# Patient Record
Sex: Female | Born: 1940 | Race: White | Hispanic: No | Smoking: Never smoker
Health system: Southern US, Community
[De-identification: ages and names within clinical notes are randomized; demographics above are authoritative.]

---

## 2004-04-07 ENCOUNTER — Ambulatory Visit: Payer: Self-pay

## 2005-05-30 ENCOUNTER — Ambulatory Visit: Payer: Self-pay | Admitting: Family Medicine

## 2006-06-05 ENCOUNTER — Ambulatory Visit: Payer: Self-pay | Admitting: Family Medicine

## 2007-06-07 ENCOUNTER — Ambulatory Visit: Payer: Self-pay | Admitting: Family Medicine

## 2008-07-29 ENCOUNTER — Ambulatory Visit: Payer: Self-pay | Admitting: Internal Medicine

## 2009-01-21 ENCOUNTER — Ambulatory Visit: Payer: Self-pay | Admitting: Internal Medicine

## 2009-07-30 ENCOUNTER — Ambulatory Visit: Payer: Self-pay | Admitting: Internal Medicine

## 2010-08-02 ENCOUNTER — Ambulatory Visit: Payer: Self-pay | Admitting: Internal Medicine

## 2011-04-01 ENCOUNTER — Emergency Department: Payer: Self-pay | Admitting: Emergency Medicine

## 2013-06-03 ENCOUNTER — Ambulatory Visit: Payer: Self-pay | Admitting: Internal Medicine

## 2016-11-02 ENCOUNTER — Other Ambulatory Visit: Payer: Self-pay | Admitting: Internal Medicine

## 2016-11-02 DIAGNOSIS — M8580 Other specified disorders of bone density and structure, unspecified site: Secondary | ICD-10-CM

## 2016-12-04 ENCOUNTER — Other Ambulatory Visit: Payer: Self-pay

## 2017-05-16 ENCOUNTER — Emergency Department
Admission: EM | Admit: 2017-05-16 | Discharge: 2017-05-16 | Disposition: A | Discharge status: Home / Self Care | Payer: Medicare Other | Attending: Emergency Medicine | Admitting: Emergency Medicine

## 2017-05-16 ENCOUNTER — Encounter: Payer: Self-pay | Admitting: Emergency Medicine

## 2017-05-16 ENCOUNTER — Other Ambulatory Visit: Payer: Self-pay

## 2017-05-16 DIAGNOSIS — F0390 Unspecified dementia without behavioral disturbance: Secondary | ICD-10-CM | POA: Insufficient documentation

## 2017-05-16 DIAGNOSIS — K625 Hemorrhage of anus and rectum: Secondary | ICD-10-CM | POA: Diagnosis present

## 2017-05-16 DIAGNOSIS — K649 Unspecified hemorrhoids: Secondary | ICD-10-CM

## 2017-05-16 LAB — CBC
HEMATOCRIT: 43.9 % (ref 35.0–47.0)
Hemoglobin: 14.9 g/dL (ref 12.0–16.0)
MCH: 30.8 pg (ref 26.0–34.0)
MCHC: 33.9 g/dL (ref 32.0–36.0)
MCV: 90.9 fL (ref 80.0–100.0)
PLATELETS: 237 10*3/uL (ref 150–440)
RBC: 4.83 MIL/uL (ref 3.80–5.20)
RDW: 13 % (ref 11.5–14.5)
WBC: 10.7 10*3/uL (ref 3.6–11.0)

## 2017-05-16 LAB — COMPREHENSIVE METABOLIC PANEL
ALT: 23 U/L (ref 14–54)
ANION GAP: 11 (ref 5–15)
AST: 27 U/L (ref 15–41)
Albumin: 4.2 g/dL (ref 3.5–5.0)
Alkaline Phosphatase: 51 U/L (ref 38–126)
BILIRUBIN TOTAL: 0.8 mg/dL (ref 0.3–1.2)
BUN: 14 mg/dL (ref 6–20)
CO2: 25 mmol/L (ref 22–32)
Calcium: 9 mg/dL (ref 8.9–10.3)
Chloride: 103 mmol/L (ref 101–111)
Creatinine, Ser: 0.72 mg/dL (ref 0.44–1.00)
GFR calc Af Amer: >60 mL/min (ref >60)
GFR calc non Af Amer: >60 mL/min (ref >60)
Glucose, Bld: 96 mg/dL (ref 65–99)
POTASSIUM: 3.3 mmol/L — AB (ref 3.5–5.1)
Sodium: 139 mmol/L (ref 135–145)
TOTAL PROTEIN: 6.9 g/dL (ref 6.5–8.1)

## 2017-05-16 NOTE — ED Provider Notes (Signed)
[facility] Emergency Department Provider Note  ___________________________________________   First MD Initiated Contact with Patient 05/16/17 2017     (approximate)  I have reviewed the triage vital signs and the nursing notes.   HISTORY  Chief Complaint Rectal Bleeding  HPI Catherine Buchanan is a 77 y.o. female with a history of intracranial hemorrhage as well as dementia who is presenting to the emergency department today with rectal bleeding over the past 2 weeks.  She is coming by her daughter who is concerned because the patient said on the phone earlier that she was "hemorrhaging."   Patient says that she has been having bleeding intermittently with stooling and sometimes in her sleep.  Says that the blood is bright red.  The patient's other daughter who is not accompanying her to the emergency department today said that there was a quarter sized spot of bright red blood on her car seat after transporting the patient which made her concerned.  The patient's daughters are why she is here today.  The daughters asked that she be evaluated in the emergency department because of the symptoms.  Patient not reporting any lightheadedness.  Denies any pain.  Not on any blood thinners.   History reviewed. No pertinent past medical history.  There are no active problems to display for this patient.   History reviewed. No pertinent surgical history.  Prior to Admission medications   Not on File    Allergies Patient has no known allergies.  History reviewed. No pertinent family history.  Social History Social History   Tobacco Use  . Smoking status: Never Smoker  Substance Use Topics  . Alcohol use: Not on file  . Drug use: Not on file    Review of Systems  Constitutional: No fever/chills Eyes: No visual changes. ENT: No sore throat. Cardiovascular: Denies chest pain. Respiratory: Denies shortness of breath. Gastrointestinal: No abdominal pain.   No nausea, no vomiting.  No diarrhea.  No constipation. Genitourinary: Negative for dysuria. Musculoskeletal: Negative for back pain. Skin: Negative for rash. Neurological: Negative for headaches, focal weakness or numbness.   ____________________________________________   PHYSICAL EXAM:  VITAL SIGNS: ED Triage Vitals  Enc Vitals Group     BP 05/16/17 1735 121/62     Pulse Rate 05/16/17 1735 75     Resp --      Temp 05/16/17 1735 99.1 F (37.3 C)     Temp Source 05/16/17 1735 Oral     SpO2 05/16/17 1735 97 %     Weight 05/16/17 1741 175 lb 11.3 oz (79.7 kg)     Height 05/16/17 1735  (1.6 m)     Head Circumference --      Peak Flow --      Pain Score 05/16/17 1735 0     Pain Loc --      Pain Edu? --      Excl. in GC? --     Constitutional: Alert and oriented. Well appearing and in no acute distress. Eyes: Conjunctivae are normal.  Head: Atraumatic. Nose: No congestion/rhinnorhea. Mouth/Throat: Mucous membranes are moist.  Neck: No stridor.   Cardiovascular: Normal rate, regular rhythm. Grossly normal heart sounds.   Respiratory: Normal respiratory effort.  No retractions. Lungs CTAB. Gastrointestinal: Soft and nontender. No distention.   External rectal exam with multiple hemorrhoids which are non-engorged.  Nontender.  No active bleeding.  Digital exam with brown stool which is heme positive.  Musculoskeletal: No lower extremity tenderness  nor edema.  No joint effusions. Neurologic:  Normal speech and language. No gross focal neurologic deficits are appreciated. Skin:  Skin is warm, dry and intact. No rash noted. Psychiatric: Mood and affect are normal. Speech and behavior are normal.  ____________________________________________   LABS (all labs ordered are listed, but only abnormal results are displayed)  Labs Reviewed  COMPREHENSIVE METABOLIC PANEL - Abnormal; Notable for the following components:      Result Value   Potassium 3.3 (*)    All other  components within normal limits  CBC  POC OCCULT BLOOD, ED   ____________________________________________  EKG   ____________________________________________  RADIOLOGY   ____________________________________________   PROCEDURES  Procedure(s) performed:   Procedures  Critical Care performed:   ____________________________________________   INITIAL IMPRESSION / ASSESSMENT AND PLAN / ED COURSE  Pertinent labs & imaging results that were available during my care of the patient were reviewed by me and considered in my medical decision making (see chart for details).  DDX: Hemorrhoid bleed, fissure, diverticular bleed, diarrhea, colitis As part of my medical decision making, I reviewed the following data within the electronic MEDICAL RECORD NUMBERReviewed patient's past outpatient visits.  Patient with normal hemoglobin after 2 weeks of symptoms.  Recent diarrhea and patient says she is not having any difficulty moving her bowels.  We will start on Preparation H.  Patient and family said they do not need a prescription for this.  I am suspecting that this is likely related to the hemorrhoids which are bleeding.  This is consistent with a history and physical exam.  Does not appear to be more proximal source such as diverticular bleeding or duodenal or gastric bleeding.  Patient will also be given follow-up with general surgery.  Patient as well as family understanding of the plan and willing to comply. ____________________________________________   FINAL CLINICAL IMPRESSION(S) / ED DIAGNOSES  GI bleeding.  Hemorrhoids.    NEW MEDICATIONS STARTED DURING THIS VISIT:  New Prescriptions   No medications on file     Note:  This document was prepared using Dragon voice recognition software and may include unintentional dictation errors.     Myrna Blazer, MD 05/16/17 [phone]

## 2017-05-16 NOTE — ED Notes (Signed)
First Nurse Note:  Patient reports rectal bleeding with bowel movements for several days but noted blood in underwear during the night.  Patient is in no obvious distress at this time.

## 2017-05-16 NOTE — ED Triage Notes (Signed)
Pt reports that she has noticed bright red blood in her panties and on her sheets. She states that she sees it on her toilet paper. She has noticed it the last two weeks.

## 2018-03-13 ENCOUNTER — Other Ambulatory Visit: Payer: Self-pay

## 2018-03-13 ENCOUNTER — Emergency Department
Admission: EM | Admit: 2018-03-13 | Discharge: 2018-03-13 | Disposition: A | Discharge status: Home / Self Care | Payer: Medicare Other | Attending: Emergency Medicine | Admitting: Emergency Medicine

## 2018-03-13 ENCOUNTER — Encounter: Payer: Self-pay | Admitting: Emergency Medicine

## 2018-03-13 DIAGNOSIS — S3992XA Unspecified injury of lower back, initial encounter: Secondary | ICD-10-CM | POA: Diagnosis present

## 2018-03-13 DIAGNOSIS — Y939 Activity, unspecified: Secondary | ICD-10-CM | POA: Diagnosis not present

## 2018-03-13 DIAGNOSIS — F0390 Unspecified dementia without behavioral disturbance: Secondary | ICD-10-CM | POA: Diagnosis not present

## 2018-03-13 DIAGNOSIS — S39012A Strain of muscle, fascia and tendon of lower back, initial encounter: Secondary | ICD-10-CM | POA: Insufficient documentation

## 2018-03-13 DIAGNOSIS — Y33XXXA Other specified events, undetermined intent, initial encounter: Secondary | ICD-10-CM | POA: Insufficient documentation

## 2018-03-13 DIAGNOSIS — Y999 Unspecified external cause status: Secondary | ICD-10-CM | POA: Diagnosis not present

## 2018-03-13 DIAGNOSIS — Y929 Unspecified place or not applicable: Secondary | ICD-10-CM | POA: Diagnosis not present

## 2018-03-13 MED ORDER — NAPROXEN 500 MG PO TABS
500.0000 mg | ORAL_TABLET | Freq: Once | ORAL | Status: AC
  Administered 2018-03-13: 500 mg via ORAL
  Filled 2018-03-13 (×2): qty 1

## 2018-03-13 MED ORDER — NAPROXEN 500 MG PO TABS: 500.0000 mg | ORAL_TABLET | Freq: Two times a day (BID) | ORAL | 2 refills | Status: DC | PRN

## 2018-03-13 NOTE — ED Provider Notes (Signed)
[facility] Emergency Department Provider Note   ____________________________________________    I have reviewed the triage vital signs and the nursing notes.   HISTORY  Chief Complaint Back Pain  History limited by dementia   HPI Catherine Buchanan is a 78 y.o. female with history of dementia who presents with complaints of right-sided low back pain, she reports occasionally the pain radiates into her right hip.  She reports the pain started today.  She denies injury.  She denies abdominal pain nausea or vomiting.  No weakness or numbness.  She describes the pain is moderate, has not take anything for this.  History reviewed. No pertinent past medical history.  There are no active problems to display for this patient.   History reviewed. No pertinent surgical history.  Prior to Admission medications   Medication Sig Start Date End Date Taking? Authorizing Provider  naproxen (NAPROSYN) 500 MG tablet Take 1 tablet (500 mg total) by mouth 2 (two) times daily as needed for moderate pain (back pain). 03/13/18   Jene Every, MD     Allergies Patient has no known allergies.  No family history on file.  Social History Social History   Tobacco Use  . Smoking status: Never Smoker  . Smokeless tobacco: Never Used  Substance Use Topics  . Alcohol use: Not on file  . Drug use: Not on file    Review of Systems  Constitutional: No fever/chills  ENT: No neck pain   Gastrointestinal: No abdominal pain.  No nausea, no vomiting.   Genitourinary: Negative for dysuria. Musculoskeletal: As above Skin: Negative for rash. Neurological: Negative for numbness or tingling or weakness    ____________________________________________   PHYSICAL EXAM:  VITAL SIGNS: ED Triage Vitals  Enc Vitals Group     BP 03/13/18 0024 114/63     Pulse Rate 03/13/18 0024 77     Resp 03/13/18 0024 16     Temp 03/13/18 0024 98.4 F (36.9 C)     Temp Source  03/13/18 0024 Oral     SpO2 03/13/18 0024 96 %     Weight 03/13/18 0025 72.6 kg (160 lb)     Height 03/13/18 0025 1.626 m (5\' 4" )     Head Circumference --      Peak Flow --      Pain Score 03/13/18 0032 10     Pain Loc --      Pain Edu? --      Excl. in GC? --      Constitutional: Alert Eyes: Conjunctivae are normal.  Head: Atraumatic. Nose: No congestion/rhinnorhea. Mouth/Throat: Mucous membranes are moist.   Cardiovascular: Normal rate, regular rhythm.  Respiratory: Normal respiratory effort.  No retractions. Abdomen: No pulsatile mass Genitourinary: deferred Musculoskeletal: Back: No vertebral tenderness palpation, right paraspinal lumbar muscle spasm which is the location of her pain Neurologic:  Normal speech and language. No gross focal neurologic deficits are appreciated.  Normal strength in the lower extremities, no saddle anesthesia Skin:  Skin is warm, dry and intact. No rash noted.   ____________________________________________   LABS (all labs ordered are listed, but only abnormal results are displayed)  Labs Reviewed - No data to display ____________________________________________  EKG   ____________________________________________  RADIOLOGY  None ____________________________________________   PROCEDURES  Procedure(s) performed: No  Procedures   Critical Care performed: No ____________________________________________   INITIAL IMPRESSION / ASSESSMENT AND PLAN / ED COURSE  Pertinent labs & imaging results that were available during my  care of the patient were reviewed by me and considered in my medical decision making (see chart for details).  Patient well-appearing in no acute distress, exam is quite reassuring, no red flags more specifically no neuro deficits saddle anesthesia abdominal pain, etc.  Treated with NSAID, outpatient follow-up with PCP   ____________________________________________   FINAL CLINICAL IMPRESSION(S) / ED  DIAGNOSES  Final diagnoses:  Strain of lumbar region, initial encounter      NEW MEDICATIONS STARTED DURING THIS VISIT:  Discharge Medication List as of 03/13/2018  3:53 AM    START taking these medications   Details  naproxen (NAPROSYN) 500 MG tablet Take 1 tablet (500 mg total) by mouth 2 (two) times daily as needed for moderate pain (back pain)., Starting Wed 03/13/2018, Print         Note:  This document was prepared using Dragon voice recognition software and may include unintentional dictation errors.   Jene Every, MD 03/13/18 [phone]

## 2018-03-13 NOTE — ED Triage Notes (Signed)
Pt to triage via w/c with no distress noted, brought in by EMS; pt reports mid lower back pain tonight with no accomp symptoms, denies injury, denies hx of same, denies radiating pain

## 2018-09-20 ENCOUNTER — Other Ambulatory Visit: Payer: Self-pay

## 2018-09-20 DIAGNOSIS — Z20822 Contact with and (suspected) exposure to covid-19: Secondary | ICD-10-CM

## 2018-09-22 LAB — NOVEL CORONAVIRUS, NAA: SARS-CoV-2, NAA: NOT DETECTED

## 2018-10-10 ENCOUNTER — Other Ambulatory Visit: Payer: Self-pay

## 2018-10-10 DIAGNOSIS — Z20822 Contact with and (suspected) exposure to covid-19: Secondary | ICD-10-CM

## 2018-10-11 ENCOUNTER — Other Ambulatory Visit: Payer: Self-pay

## 2018-10-11 DIAGNOSIS — Z20822 Contact with and (suspected) exposure to covid-19: Secondary | ICD-10-CM

## 2018-10-11 LAB — NOVEL CORONAVIRUS, NAA: SARS-CoV-2, NAA: NOT DETECTED

## 2018-10-12 LAB — NOVEL CORONAVIRUS, NAA: SARS-CoV-2, NAA: NOT DETECTED

## 2019-02-15 ENCOUNTER — Emergency Department: Payer: Medicare Other

## 2019-02-15 ENCOUNTER — Other Ambulatory Visit: Payer: Self-pay

## 2019-02-15 ENCOUNTER — Emergency Department
Admission: EM | Admit: 2019-02-15 | Discharge: 2019-02-15 | Disposition: A | Discharge status: Home / Self Care | Payer: Medicare Other | Attending: Emergency Medicine | Admitting: Emergency Medicine

## 2019-02-15 DIAGNOSIS — U071 COVID-19: Secondary | ICD-10-CM | POA: Diagnosis not present

## 2019-02-15 DIAGNOSIS — Y999 Unspecified external cause status: Secondary | ICD-10-CM | POA: Insufficient documentation

## 2019-02-15 DIAGNOSIS — Y9389 Activity, other specified: Secondary | ICD-10-CM | POA: Diagnosis not present

## 2019-02-15 DIAGNOSIS — W19XXXA Unspecified fall, initial encounter: Secondary | ICD-10-CM | POA: Diagnosis not present

## 2019-02-15 DIAGNOSIS — M542 Cervicalgia: Secondary | ICD-10-CM | POA: Insufficient documentation

## 2019-02-15 DIAGNOSIS — I959 Hypotension, unspecified: Secondary | ICD-10-CM | POA: Diagnosis present

## 2019-02-15 DIAGNOSIS — E860 Dehydration: Secondary | ICD-10-CM | POA: Insufficient documentation

## 2019-02-15 DIAGNOSIS — Y92128 Other place in nursing home as the place of occurrence of the external cause: Secondary | ICD-10-CM | POA: Insufficient documentation

## 2019-02-15 LAB — CBC WITH DIFFERENTIAL/PLATELET
Abs Immature Granulocytes: 0.04 10*3/uL (ref 0.00–0.07)
Basophils Absolute: 0.1 10*3/uL (ref 0.0–0.1)
Basophils Relative: 1 %
Eosinophils Absolute: 0.2 10*3/uL (ref 0.0–0.5)
Eosinophils Relative: 3 %
HCT: 40.2 % (ref 36.0–46.0)
Hemoglobin: 13.7 g/dL (ref 12.0–15.0)
Immature Granulocytes: 1 %
Lymphocytes Relative: 31 %
Lymphs Abs: 2.4 10*3/uL (ref 0.7–4.0)
MCH: 30.9 pg (ref 26.0–34.0)
MCHC: 34.1 g/dL (ref 30.0–36.0)
MCV: 90.5 fL (ref 80.0–100.0)
Monocytes Absolute: 0.6 10*3/uL (ref 0.1–1.0)
Monocytes Relative: 8 %
Neutro Abs: 4.4 10*3/uL (ref 1.7–7.7)
Neutrophils Relative %: 56 %
Platelets: 160 10*3/uL (ref 150–400)
RBC: 4.44 MIL/uL (ref 3.87–5.11)
RDW: 12.9 % (ref 11.5–15.5)
WBC: 7.7 10*3/uL (ref 4.0–10.5)
nRBC: 0 % (ref 0.0–0.2)

## 2019-02-15 LAB — COMPREHENSIVE METABOLIC PANEL
ALT: 24 U/L (ref 0–44)
AST: 24 U/L (ref 15–41)
Albumin: 3.7 g/dL (ref 3.5–5.0)
Alkaline Phosphatase: 52 U/L (ref 38–126)
Anion gap: 10 (ref 5–15)
BUN: 28 mg/dL — ABNORMAL HIGH (ref 8–23)
CO2: 24 mmol/L (ref 22–32)
Calcium: 9.4 mg/dL (ref 8.9–10.3)
Chloride: 106 mmol/L (ref 98–111)
Creatinine, Ser: 1.05 mg/dL — ABNORMAL HIGH (ref 0.44–1.00)
GFR calc Af Amer: 59 mL/min — ABNORMAL LOW (ref >60)
GFR calc non Af Amer: 51 mL/min — ABNORMAL LOW (ref >60)
Glucose, Bld: 162 mg/dL — ABNORMAL HIGH (ref 70–99)
Potassium: 3 mmol/L — ABNORMAL LOW (ref 3.5–5.1)
Sodium: 140 mmol/L (ref 135–145)
Total Bilirubin: 0.9 mg/dL (ref 0.3–1.2)
Total Protein: 6.3 g/dL — ABNORMAL LOW (ref 6.5–8.1)

## 2019-02-15 LAB — URINALYSIS, COMPLETE (UACMP) WITH MICROSCOPIC
Bacteria, UA: NONE SEEN
Bilirubin Urine: NEGATIVE
Glucose, UA: NEGATIVE mg/dL
Hgb urine dipstick: NEGATIVE
Ketones, ur: NEGATIVE mg/dL
Leukocytes,Ua: NEGATIVE
Nitrite: NEGATIVE
Protein, ur: NEGATIVE mg/dL
Specific Gravity, Urine: 1.014 (ref 1.005–1.030)
Squamous Epithelial / LPF: NONE SEEN (ref 0–5)
pH: 5 (ref 5.0–8.0)

## 2019-02-15 LAB — RESPIRATORY PANEL BY RT PCR (FLU A&B, COVID)
Influenza A by PCR: NEGATIVE
Influenza B by PCR: NEGATIVE
SARS Coronavirus 2 by RT PCR: POSITIVE — AB

## 2019-02-15 LAB — TROPONIN I (HIGH SENSITIVITY): Troponin I (High Sensitivity): <2 ng/L (ref <18)

## 2019-02-15 MED ORDER — SODIUM CHLORIDE 0.9 % IV BOLUS
1000.0000 mL | Freq: Once | INTRAVENOUS | Status: AC
  Administered 2019-02-15: 18:00:00 1000 mL via INTRAVENOUS

## 2019-02-15 MED ORDER — SODIUM CHLORIDE 0.9 % IV BOLUS
1000.0000 mL | Freq: Once | INTRAVENOUS | Status: AC
  Administered 2019-02-15: 1000 mL via INTRAVENOUS

## 2019-02-15 NOTE — ED Notes (Signed)
Pt unable to sign discharge paper due to hx of dementia. Bosie Clos, RN received verbal consent for discharge from daughter, Dorna Mai.

## 2019-02-15 NOTE — ED Notes (Addendum)
Upon entering the room to check on the pt, this RN found the pt standing at the foot of the bed trying to go to the bathroom. This RN assisted pt to the toilet and back to the bed. Pt noted to be slightly unsteady on her feet. Stretcher locked in lowest position, bed alarm turned on, call bell within reach. This RN reminded pt to use the call bell and not to get up on her own.  Yellow socks placed on pt, yellow fall bracelet on pt.

## 2019-02-15 NOTE — ED Provider Notes (Signed)
[facility] Emergency Department Provider Note  Time seen: 6:24 PM  I have reviewed the triage vital signs and the nursing notes.   HISTORY  Chief Complaint Fall and Hypotension   HPI Catherine Buchanan is a 79 y.o. female presents to the emergency department after a fall found to be hypotensive.  According to EMS report patient is coming from [place] facility.  Patient had an unwitnessed fall while trying to go to the bathroom.  Initial blood pressure per EMS on arrival 66/36.  96/52 on arrival to the emergency department.  Currently patient is awake and alert, no distress is complaining of some neck pain since the fall but denies any back pain.  Is not sure if she hit her head per patient.  Denies LOC.   EMS states some abdominal pain complaint however upon arrival patient denies any abdominal pain.  Benign abdominal exam.  History reviewed. No pertinent past medical history.  There are no problems to display for this patient.   History reviewed. No pertinent surgical history.  Prior to Admission medications   Medication Sig Start Date End Date Taking? Authorizing Provider  naproxen (NAPROSYN) 500 MG tablet Take 1 tablet (500 mg total) by mouth 2 (two) times daily as needed for moderate pain (back pain). 03/13/18   Lavonia Drafts, MD    No Known Allergies  No family history on file.  Social History Social History   Tobacco Use  . Smoking status: Never Smoker  . Smokeless tobacco: Never Used  Substance Use Topics  . Alcohol use: Not on file  . Drug use: Not on file    Review of Systems Constitutional: Negative for fever. Cardiovascular: Negative for chest pain. Respiratory: Negative for shortness of breath. Gastrointestinal: Negative for abdominal pain, vomiting and diarrhea. Genitourinary: Negative for urinary compaints Musculoskeletal: Neck pain. Skin: Negative for skin complaints  Neurological: Negative for headache All other  ROS negative  ____________________________________________   PHYSICAL EXAM:  VITAL SIGNS: ED Triage Vitals [02/15/19 1817]  Enc Vitals Group     BP      Pulse      Resp      Temp      Temp src      SpO2      Weight 165 lb (74.8 kg)     Height 5\' 8"  (1.727 m)     Head Circumference      Peak Flow      Pain Score 0     Pain Loc      Pain Edu?      Excl. in [place]?    Constitutional: Alert. Well appearing and in no distress. Eyes: Normal exam ENT      Head: Normocephalic and atraumatic.      Mouth/Throat: Mucous membranes are moist. Cardiovascular: Normal rate, regular rhythm. Respiratory: Normal respiratory effort without tachypnea nor retractions. Breath sounds are clear  Gastrointestinal: Soft and nontender. No distention. Musculoskeletal: Mild midline cervical tenderness to palpation. Neurologic:  Normal speech and language. No gross focal neurologic deficits Skin:  Skin is warm, dry and intact.  Psychiatric: Mood and affect are normal.   ____________________________________________    EKG  EKG viewed and interpreted by myself shows a normal sinus rhythm at 68 bpm with a narrow QRS, normal axis, normal intervals nonspecific ST changes.  ____________________________________________    RADIOLOGY  CT head and C-spine are negative for acute abnormality. Chest x-ray is negative.  ____________________________________________   INITIAL IMPRESSION / ASSESSMENT  AND PLAN / ED COURSE  Pertinent labs & imaging results that were available during my care of the patient were reviewed by me and considered in my medical decision making (see chart for details).   Patient presents to the emergency department for a fall found to be hypotensive currently 85 systolic in the emergency department.  Patient is awake alert, no distress.  Does have mild C-spine tenderness palpation remainder of the exam is benign including benign abdomen.  No signs of head trauma no back pain.  We will  obtain CT scans of the head and C-spine as a precaution we will check labs, IV hydrate and continue to closely monitor.  Patient's imaging is negative, lab work is largely reassuring.  Covid test is positive however daughter states the patient has a +3 to 4 weeks ago initially.  Patient appears much better after fluids blood pressure currently around 115 systolic.  Patient is mentating very well, has no complaints at this time.  We will discharge back to her nursing facility.  Catherine Buchanan was evaluated in Emergency Department on 02/15/2019 for the symptoms described in the history of present illness. She was evaluated in the context of the global COVID-19 pandemic, which necessitated consideration that the patient might be at risk for infection with the SARS-CoV-2 virus that causes COVID-19. Institutional protocols and algorithms that pertain to the evaluation of patients at risk for COVID-19 are in a state of rapid change based on information released by regulatory bodies including the CDC and federal and state organizations. These policies and algorithms were followed during the patient's care in the ED.  ____________________________________________   FINAL CLINICAL IMPRESSION(S) / ED DIAGNOSES  Fall Hypotension   Minna Antis, MD 02/15/19 2212

## 2019-02-15 NOTE — ED Notes (Signed)
Daughter states that mother tested positive for COVID one month ago, was asymptomatic and stayed at [facility] while positive.  Also stated that mother has hx of dementia, baseline to be disoriented to time

## 2019-02-15 NOTE — ED Notes (Signed)
In and out cath performed with Lorin Picket, NT

## 2019-02-15 NOTE — ED Triage Notes (Signed)
Pt to ED via [facility] from [facility] for chief complaint of fall, hypotension. EMS reports pt had an unwitnessed fall while trying to go to the bathroom. BP was 76/46 on arrival, placed in [place] for a BP of 96/52. BP 66/36 on arrival.  Pt disoriented to time, situation and place C/o neck pain at this time EDP at bedside

## 2019-02-15 NOTE — ED Notes (Signed)
Attempted to speak with daughter regarding providing transportation back to [facility], daughter stated she had not yet had COVID and was unsure whether she wanted to provide transportation, daughter proceeded to put husband on the phone, Husband spoke rudely to this RN stating that he would not be providing transportation d/t pt being COVID +.

## 2019-02-15 NOTE — Discharge Instructions (Addendum)
Please drink plenty of fluids over the next several days, return to the emergency department for any further falls, lightheadedness, or any other symptom personally concerning to yourself.

## 2019-02-15 NOTE — ED Notes (Addendum)
Report given to [employer], [facility].  Daughter, Dorna Mai updated on discharge, no other questions or concerns for d/c, aware that pt will be going back to [facility].

## 2020-03-18 IMAGING — CT CT HEAD W/O CM
2 series · 15 of 37 positions shown, 18 images · non-contrast
Comparison: None.

CLINICAL DATA: Fall.

EXAM:
CT HEAD WITHOUT CONTRAST
CT CERVICAL SPINE WITHOUT CONTRAST
TECHNIQUE: Multidetector CT imaging of the head and cervical spine was
performed following the standard protocol without intravenous
contrast. Multiplanar CT image reconstructions of the cervical spine
were also generated.

[Series 5: head wo · axial · 0.40mm/px · z∈[+450,+575]mm · 12 of 30 slices shown, 15 images]
[im 3/30  brain]
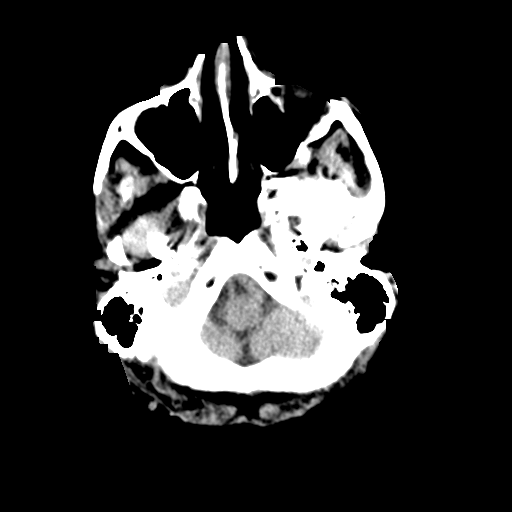
[im 3/30  bone]
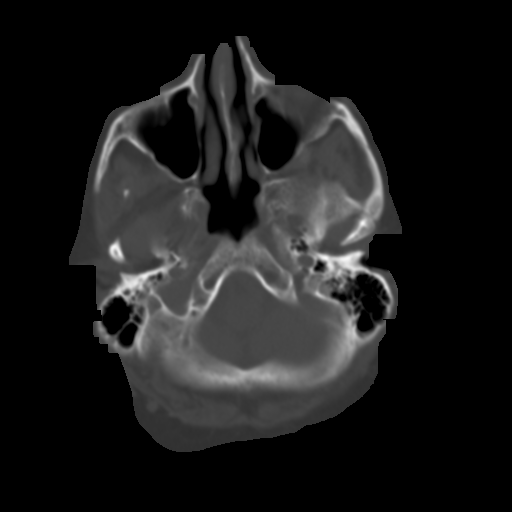
[im 5/30  brain]
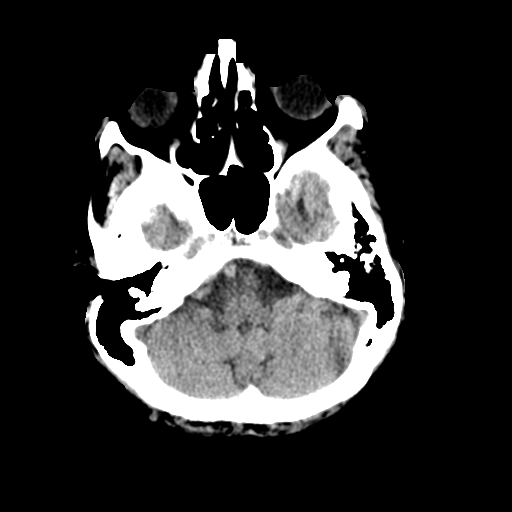
[im 7/30  brain]
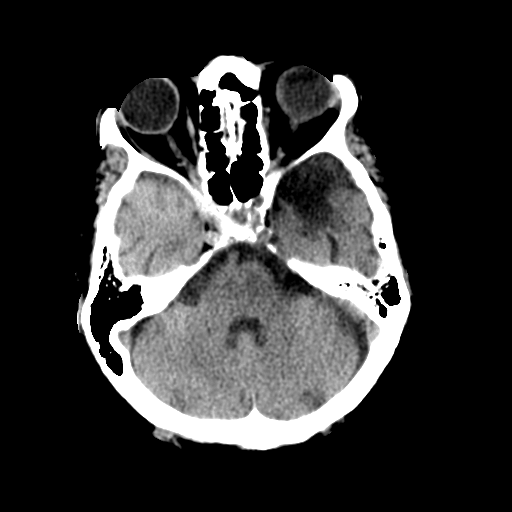
[im 10/30  brain]
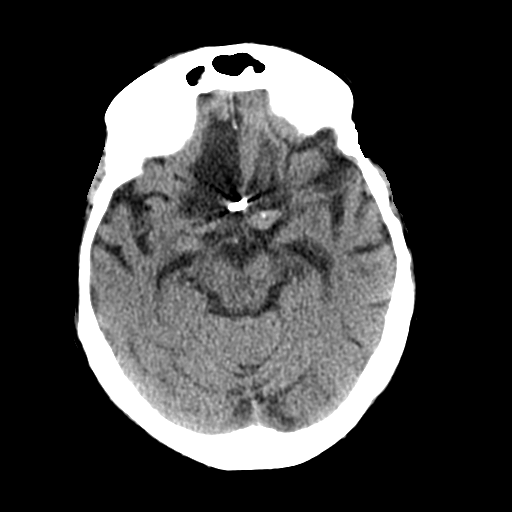
[im 12/30  brain]
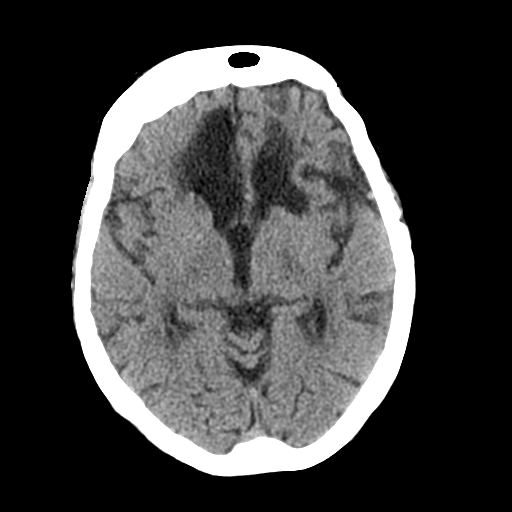
[im 12/30  bone]
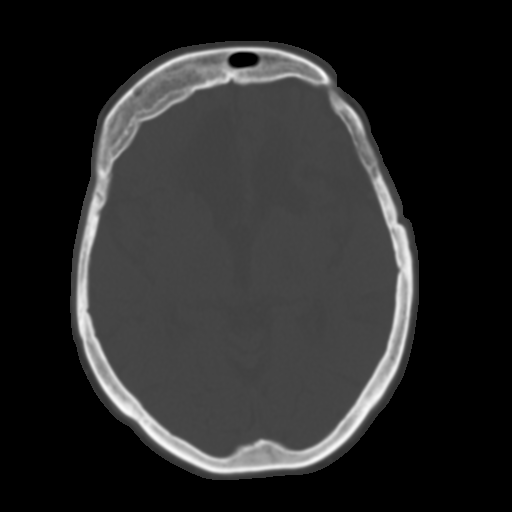
[im 14/30  brain]
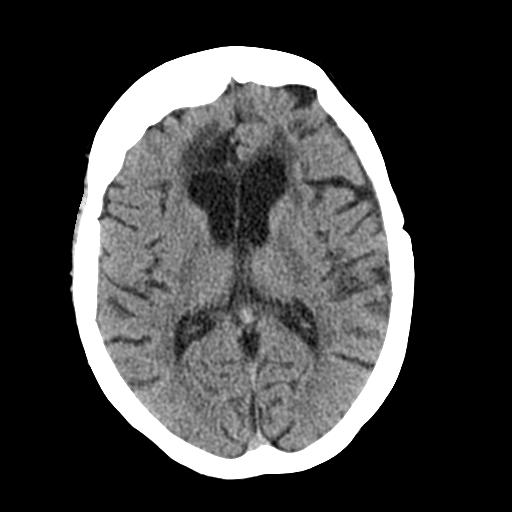
[im 17/30  brain]
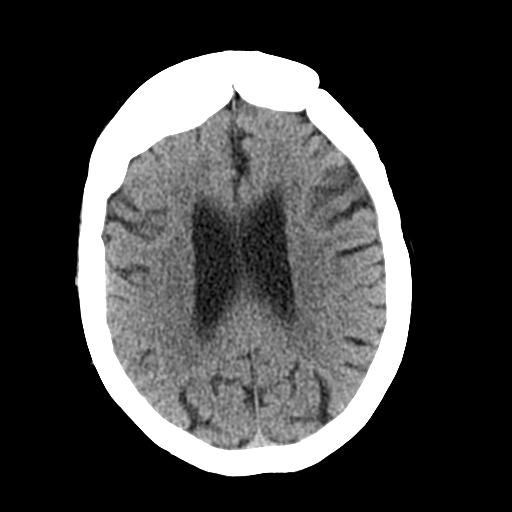
[im 19/30  brain]
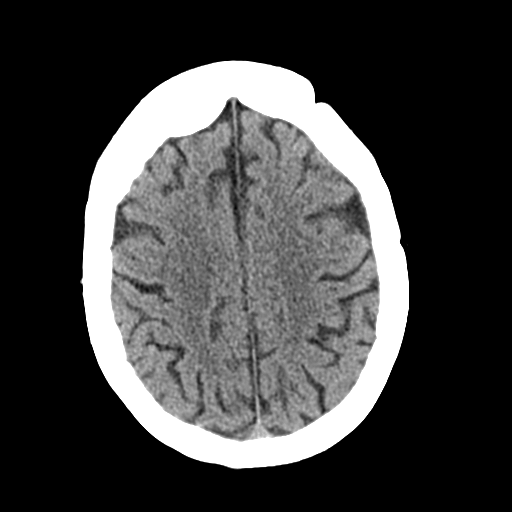
[im 21/30  brain]
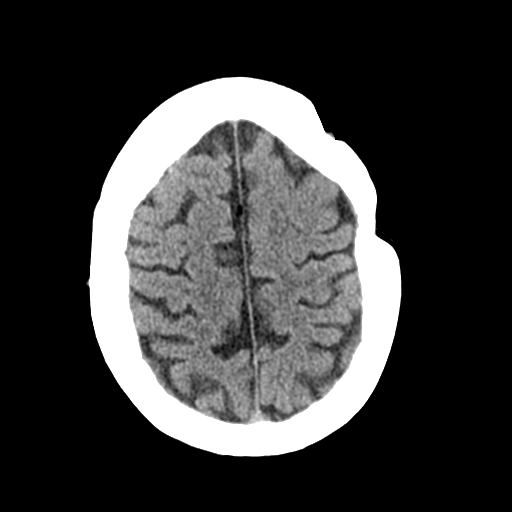
[im 21/30  bone]
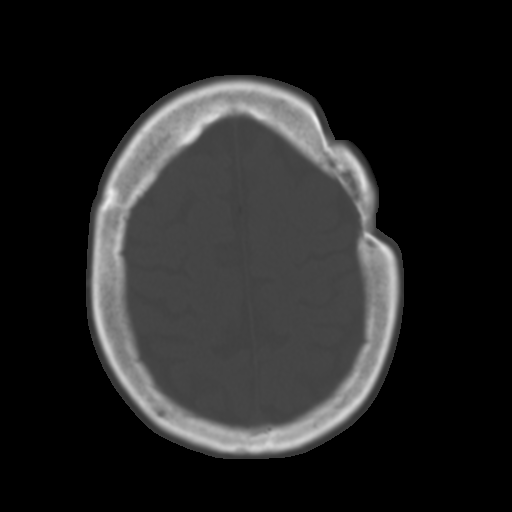
[im 24/30  brain]
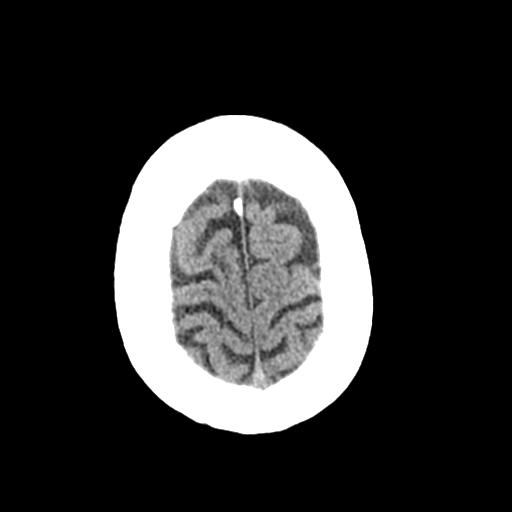
[im 26/30  brain]
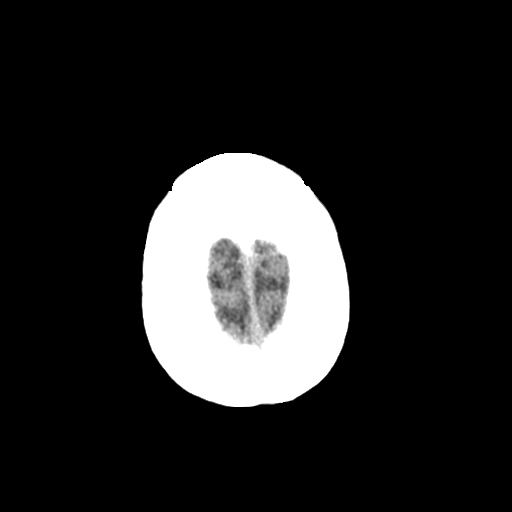
[im 28/30  brain]
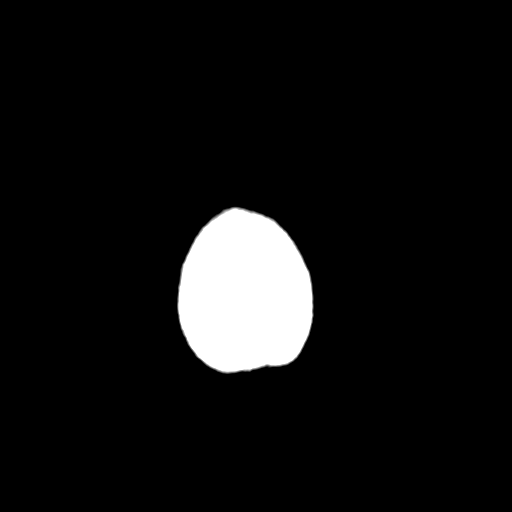

[Series 7: sagittal soft tissue · sagittal · 0.30mm/px · 3 of 48 slices shown]
[im 16/48  brain]
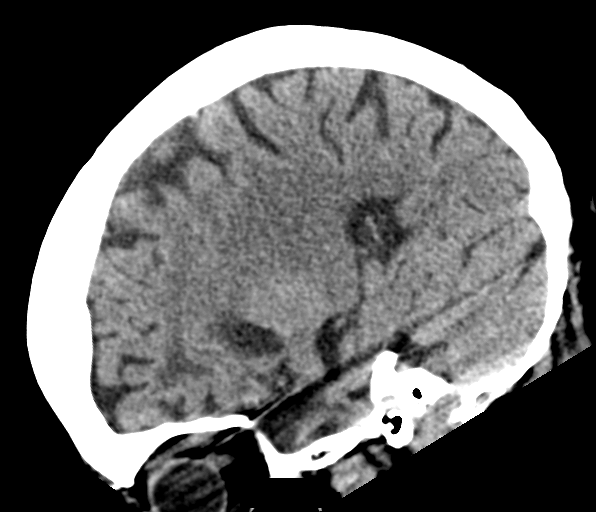
[im 24/48  brain]
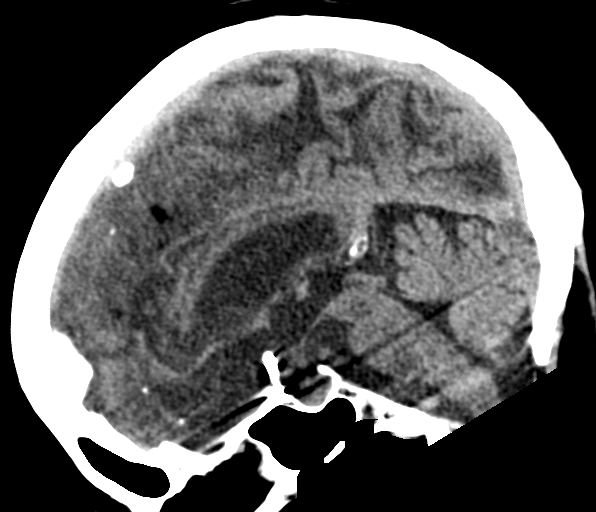
[im 32/48  brain]
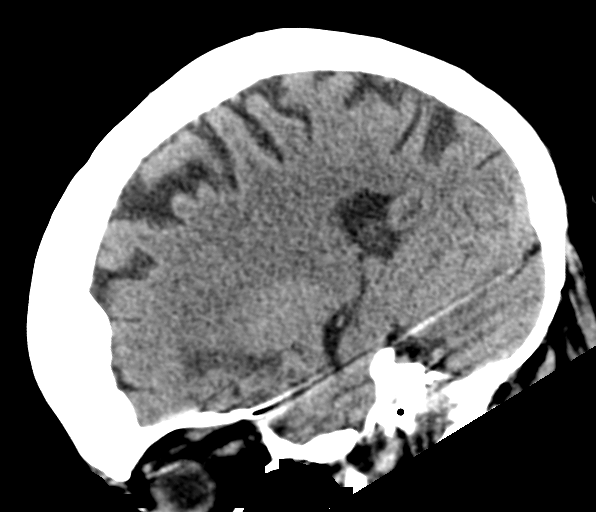

[15 of 37 positions shown; findings below may reference images not displayed]

FINDINGS: CT HEAD FINDINGS

Brain: No subdural, epidural, or subarachnoid hemorrhage. Clips are
seen in the suprasellar cistern, likely due to previous aneurysm
repair. There is encephalomalacia in the anterior tip of the left
temporal lobe and in the bilateral frontal lobes, right greater than
left. Ventricles and sulci are unremarkable. Cerebellum, brainstem,
and basal cisterns are normal. No mass effect or midline shift.
White matter changes are identified in the frontal lobes, likely
from previous aneurysm repair/clipping. No acute cortical ischemia
or infarct is identified.

Vascular: Calcified atherosclerosis in the intracranial carotids.

Skull: Previous left craniotomy. Hyperostosis frontalis is
identified.

Sinuses/Orbits: Debris in the right sphenoid sinus. No fluid.
Paranasal sinuses, mastoid air cells, and middle ears otherwise
normal.

Other: No other abnormalities.

CT CERVICAL SPINE FINDINGS

Alignment: Normal.

Skull base and vertebrae: No acute fracture. No primary bone lesion
or focal pathologic process.

Soft tissues and spinal canal: Atherosclerotic change in the
thoracic aorta. No other abnormalities.

Disc levels:  Multilevel degenerative changes.

Upper chest: Negative.

Other: No other abnormalities.
IMPRESSION: 1. No acute intracranial abnormality. Chronic changes from previous
clipping/repair of an aneurysm.
2. No fracture or traumatic malalignment in the cervical spine.

## 2022-12-06 ENCOUNTER — Telehealth: Payer: Self-pay

## 2022-12-06 NOTE — Telephone Encounter (Signed)
Crystal called the patient nursing home called in to see what is needed for the patient referral.

## 2023-02-06 ENCOUNTER — Ambulatory Visit: Payer: Medicare (Managed Care) | Admitting: Gastroenterology

## 2023-02-08 ENCOUNTER — Encounter: Payer: Self-pay | Admitting: Gastroenterology

## 2023-02-08 ENCOUNTER — Ambulatory Visit: Payer: Medicare (Managed Care) | Admitting: Gastroenterology

## 2023-02-08 VITALS — BP 136/77 | HR 84 | Temp 98.3°F | Ht 63.0 in | Wt 170.0 lb

## 2023-02-08 DIAGNOSIS — K642 Third degree hemorrhoids: Secondary | ICD-10-CM | POA: Diagnosis not present

## 2023-02-08 NOTE — Patient Instructions (Signed)
High-Fiber Eating Plan Fiber, also called dietary fiber, is found in foods such as fruits, vegetables, whole grains, and beans. A high-fiber diet can be good for your health. Your health care provider may recommend a high-fiber diet to help: Prevent trouble pooping (constipation). Lower your cholesterol. Treat the following conditions: Hemorrhoids. This is inflammation of veins in the anus. Inflammation of specific areas of the digestive tract. Irritable bowel syndrome (IBS). This is a problem of the large intestine, also called the colon, that sometimes causes belly pain and bloating. Prevent overeating as part of a weight-loss plan. Lower the risk of heart disease, type 2 diabetes, and certain cancers. What are tips for following this plan? Reading food labels  Check the nutrition facts label on foods for the amount of dietary fiber. Choose foods that have 4 grams of fiber or more per serving. The recommended goals for how much fiber you should eat each day include: Males 23 years old or younger: 30-34 g. Males over 66 years old: 28-34 g. Females 27 years old or younger: 25-28 g. Females over 14 years old: 22-25 g. Your daily fiber goal is _____________ g. Shopping Choose whole fruits and vegetables instead of processed. For example, choose apples instead of apple juice or applesauce. Choose a variety of high-fiber foods such as avocados, lentils, oats, and pinto beans. Read the nutrition facts label on foods. Check for foods with added fiber. These foods often have high sugar and salt (sodium) amounts per serving. Cooking Use whole-grain flour for baking and cooking. Cook with brown rice instead of white rice. Make meals that have a lot of beans and vegetables in them, such as chili or vegetable-based soups. Meal planning Start the day with a breakfast that is high in fiber, such as a cereal that has 5 g of fiber or more per serving. Eat breads and cereals that are made with  whole-grain flour instead of refined flour or white flour. Eat brown rice, bulgur wheat, or millet instead of white rice. Use beans in place of meat in soups, salads, and pasta dishes. Be sure that half of the grains you eat each day are whole grains. General information You can get the recommended amount of dietary fiber by: Eating a variety of fruits, vegetables, grains, nuts, and beans. Taking a fiber supplement if you aren't able to eat enough fiber. It's better to get fiber through food than from a supplement. Slowly increase how much fiber you eat. If you increase the amount of fiber you eat too quickly, you may have bloating, cramping, or gas. Drink plenty of water to help you digest fiber. Choose high-fiber snacks, such as berries, raw vegetables, nuts, and popcorn. What foods should I eat? Fruits Berries. Pears. Apples. Oranges. Avocado. Prunes and raisins. Dried figs. Vegetables Sweet potatoes. Spinach. Kale. Artichokes. Cabbage. Broccoli. Cauliflower. Green peas. Carrots. Squash. Grains Whole-grain breads. Multigrain cereal. Oats and oatmeal. Brown rice. Barley. Bulgur wheat. Millet. Quinoa. Bran muffins. Popcorn. Rye wafer crackers. Meats and other proteins Navy beans, kidney beans, and pinto beans. Soybeans. Split peas. Lentils. Nuts and seeds. Dairy Fiber-fortified yogurt. Fortified means that fiber has been added to the product. Beverages Fiber-fortified soy milk. Fiber-fortified orange juice. Other foods Fiber bars. The items listed above may not be all the foods and drinks you can have. Talk to a dietitian to learn more. What foods should I avoid? Fruits Fruit juice. Cooked, strained fruit. Vegetables Fried potatoes. Canned vegetables. Well-cooked vegetables. Grains White bread. Pasta made with refined  flour. White rice. Meats and other proteins Fatty meat. Fried chicken or fried fish. Dairy Milk. Cream cheese. Sour cream. Fats and  oils Butters. Beverages Soft drinks. Other foods Cakes and pastries. The items listed above may not be all the foods and drinks you should avoid. Talk to a dietitian to learn more. This information is not intended to replace advice given to you by your health care provider. Make sure you discuss any questions you have with your health care provider. Document Revised: 03/27/2022 Document Reviewed: 03/27/2022 Elsevier Patient Education  2024 [employer]Hemorrhoids Hemorrhoids are swollen veins that may form: In the butt (rectum). These are called internal hemorrhoids. Around the opening of the butt (anus). These are called external hemorrhoids. Most hemorrhoids do not cause very bad problems. They often get better with changes to your lifestyle and what you eat. What are the causes? Having trouble pooping (constipation) or watery poop (diarrhea). Pushing too hard when you poop. Pregnancy. Being very overweight (obese). Sitting for too long. Riding a bike for a long time. Heavy lifting or other things that take a lot of effort. Anal sex. What are the signs or symptoms? Pain. Itching or soreness in the butt. Bleeding from the butt. Leaking poop. Swelling. One or more lumps around the opening of your butt. How is this treated? In most cases, hemorrhoids can be treated at home. You may be told to: Change what you eat. Make changes to your lifestyle. If these treatments do not help, you may need to have a procedure done. Your doctor may need to: Place rubber bands at the bottom of the hemorrhoids to make them fall off. Put medicine into the hemorrhoids to shrink them. Shine a type of light on the hemorrhoids to cause them to fall off. Do surgery to get rid of the hemorrhoids. Follow these instructions at home: Medicines Take over-the-counter and prescription medicines only as told by your doctor. Use creams with medicine in them or medicines that you put in your butt as told by  your doctor. Eating and drinking  Eat foods that have a lot of fiber in them. These include whole grains, beans, nuts, fruits, and vegetables. Ask your doctor about taking products that have fiber added to them (fibersupplements). Take in less fat. You can do this by: Eating low-fat dairy products. Eating less red meat. Staying away from processed foods. Drink enough fluid to keep your pee (urine) pale yellow. Managing pain and swelling  Take a warm-water bath (sitz bath) for 20 minutes to ease pain. Do this 3-4 times a day. You may do this in a bathtub. You may also use a portable sitz bath that fits over the toilet. If told, put ice on the painful area. It may help to use ice between your warm baths. Put ice in a plastic bag. Place a towel between your skin and the bag. Leave the ice on for 20 minutes, 2-3 times a day. If your skin turns bright red, take off the ice right away to prevent skin damage. The risk of damage is higher if you cannot feel pain, heat, or cold. General instructions Exercise. Ask your doctor how much and what kind of exercise is best for you. Go to the bathroom when you need to poop. Do not wait. Try not to push too hard when you poop. Keep your butt dry and clean. Use wet toilet paper or moist towelettes after you poop. Do not sit on the toilet for a long time. Contact  a doctor if: You have pain and swelling that do not get better with treatment. You have trouble pooping. You cannot poop. You have pain or swelling outside the area of the hemorrhoids. Get help right away if: You have bleeding from the butt that will not stop. This information is not intended to replace advice given to you by your health care provider. Make sure you discuss any questions you have with your health care provider. Document Revised: 09/14/2021 Document Reviewed: 09/14/2021 Elsevier Patient Education  2024 [employer].

## 2023-02-08 NOTE — Progress Notes (Signed)
Wyline Mood MD, MRCP(U.K) [address]  [address]  [place], [place] [address]  Main: [phone]  Fax: [phone]   Gastroenterology Consultation  Referring Provider:     Michael Boston, NP Primary Care Physician:  Pcp, No Primary Gastroenterologist:  Dr. Wyline Mood  Reason for Consultation:     Hemorrhoids         HPI:   Catherine Buchanan is a 83 y.o. y/o female referred for internal hemorrhoids .  She is here today with her daughter.  The patient has dementia very limited history.  She was not sure why she was here.  According to the daughter she stays in a care home and a few weeks back had a lot of rectal bleeding associated with a bowel movement and they noticed a pea-sized lesion on her anus which was bleeding and it was thought to be from hemorrhoids and she want to have that looked into.  She recollect she has had a colonoscopy about 30 years back.  No other complaints.  Presently has no rectal bleeding.    No past medical history on file.  No past surgical history on file.  Prior to Admission medications   Medication Sig Start Date End Date Taking? Authorizing Provider  amLODipine (NORVASC) 10 MG tablet Take 10 mg by mouth daily. 02/03/19   [provider]  B Complex-C-Folic Acid (SUPER B-COMPLEX/VIT C/FA PO) Take 1 tablet by mouth daily.    [provider]  lisinopril-hydrochlorothiazide (ZESTORETIC) 20-25 MG tablet Take 1 tablet by mouth daily. 02/03/19   [provider]  memantine (NAMENDA) 10 MG tablet Take 10 mg by mouth 2 (two) times daily. 02/03/19   [provider]  Multiple Vitamin (MULTIVITAMIN WITH MINERALS) TABS tablet Take 1 tablet by mouth daily.    [provider]  Omega-3 Fatty Acids (FISH OIL) 1000 MG CAPS Take 1,000 mg by mouth daily.    [provider]  potassium chloride (KLOR-CON) 10 MEQ tablet Take 10 mEq by mouth daily. 02/03/19   [provider]  pravastatin (PRAVACHOL) 40 MG tablet  Take 40 mg by mouth every evening.  02/07/19   [provider]    No family history on file.   Social History   Tobacco Use   Smoking status: Never   Smokeless tobacco: Never    Allergies as of 02/08/2023   (No Known Allergies)    Review of Systems:    All systems reviewed and negative except where noted in HPI.   Physical Exam:  BP 136/77   Pulse 84   Temp 98.3 F (36.8 C) (Oral)   Ht 5\' 3"  (1.6 m)   Wt 170 lb (77.1 kg)   BMI 30.11 kg/m  No LMP recorded. Patient is postmenopausal. Patient is oriented to place but not to time and person.  Moving all 4 limbs. Chaperone present in the room.  CMA Chan  On left lateral position externally appears she had a uterine prolapse.  Externally she had prolapsing third-degree hemorrhoids which appeared inflamed which could only be reduced if I push them back inside.   Imaging Studies: No results found.  Assessment and Plan:   Catherine Buchanan is a 83 y.o. y/o female has been referred for hemorrhoids which I could confirm on external examination that she had third-degree hemorrhoids.  They appear inflamed.  I explained to the patient and to the daughter that this is not likely amenable to hemorrhoidal banding and would require surgery  if intervention was desired.  Since it was not causing her any problem at this point of time I strongly recommended we use conservative management for the same in view of patient's dementia.  This would include avoid excess cleaning after bowel movement using nonmedicated wipes using very soft tissue paper.  Using a sitz bath.  Using Anusol topical for not more than 5 days.  Avoid constipation.  If it recurs on a recurrent basis may need to consider if surgery is an option.  Follow up in as needed.  Dr Wyline Mood MD,MRCP(U.K)
